# Patient Record
Sex: Female | Born: 1995 | Race: Black or African American | Hispanic: No | Marital: Single | Smoking: Never smoker
Health system: Southern US, Community
[De-identification: ages and names within clinical notes are randomized; demographics above are authoritative.]

## PROBLEM LIST (undated history)

## (undated) DIAGNOSIS — L732 Hidradenitis suppurativa: Secondary | ICD-10-CM

---
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
## 2013-11-24 ENCOUNTER — Ambulatory Visit: Payer: Self-pay | Admitting: Family Medicine

## 2014-12-14 ENCOUNTER — Ambulatory Visit
Admission: EM | Admit: 2014-12-14 | Discharge: 2014-12-14 | Payer: No Typology Code available for payment source | Attending: Family Medicine | Admitting: Family Medicine

## 2014-12-14 ENCOUNTER — Encounter: Payer: Self-pay | Admitting: Gynecology

## 2014-12-14 DIAGNOSIS — L0501 Pilonidal cyst with abscess: Secondary | ICD-10-CM

## 2014-12-14 NOTE — Discharge Instructions (Signed)
Pilonidal Cyst A pilonidal cyst occurs when hairs get trapped (ingrown) beneath the skin in the crease between the buttocks over your sacrum (the bone under that crease). Pilonidal cysts are most common in young men with a lot of body hair. When the cyst is ruptured (breaks) or leaking, fluid from the cyst may cause burning and itching. If the cyst becomes infected, it causes a painful swelling filled with pus (abscess). The pus and trapped hairs need to be removed (often by lancing) so that the infection can heal. However, recurrence is common and an operation may be needed to remove the cyst. HOME CARE INSTRUCTIONS   If the cyst was NOT INFECTED:  Keep the area clean and dry. Bathe or shower daily. Wash the area well with a germ-killing soap. Warm tub baths may help prevent infection and help with drainage. Dry the area well with a towel.  Avoid tight clothing to keep area as moisture free as possible.  Keep area between buttocks as free of hair as possible. A depilatory may be used.  If the cyst WAS INFECTED and needed to be drained:  Your caregiver packed the wound with gauze to keep the wound open. This allows the wound to heal from the inside outwards and continue draining.  Return for a wound check in 1 day or as suggested.  If you take tub baths or showers, repack the wound with gauze following them. Sponge baths (at the sink) are a good alternative.  If an antibiotic was ordered to fight the infection, take as directed.  Only take over-the-counter or prescription medicines for pain, discomfort, or fever as directed by your caregiver.  After the drain is removed, use sitz baths for 20 minutes 4 times per day. Clean the wound gently with mild unscented soap, pat dry, and then apply a dry dressing. SEEK MEDICAL CARE IF:   You have increased pain, swelling, redness, drainage, or bleeding from the area.  You have a fever.  You have muscles aches, dizziness, or a general ill  feeling. Document Released: 07/15/2000 Document Revised: 10/10/2011 Document Reviewed: 09/12/2008 Exit[employer] Patient Information 2015 [employer]. This information is not intended to replace advice given to you by your health care provider. Make sure you discuss any questions you have with your health care provider.  

## 2014-12-14 NOTE — ED Notes (Signed)
Patient c/o boil on right buttock cheek x 4 days ago. Pt. Stated boil inflamed/ redness / swelling and painful.

## 2014-12-14 NOTE — ED Provider Notes (Signed)
CSN: [id number]     Arrival date & time 12/14/14  1123 History   First MD Initiated Contact with Patient 12/14/14 1309     Chief Complaint  Patient presents with  . Recurrent Skin Infections   (Consider location/radiation/quality/duration/timing/severity/associated sxs/prior Treatment) HPI       19 year old female presents for evaluation of a pilonidal abscess. She has had one of these in the past the drain on its own. She started having pain and swelling in the gluteal cleft 5 days ago and it has gotten progressively worse. She is also felt ill with subjective fever and chills, nausea, and loss of appetite. The pain has grown increasingly severe. She does not think it has drained at all.  History reviewed. No pertinent past medical history. History reviewed. No pertinent past surgical history. No family history on file. History  Substance Use Topics  . Smoking status: Never Smoker   . Smokeless tobacco: Not on file  . Alcohol Use: No   OB History    No data available     Review of Systems  Skin: Positive for wound (see history of present illness).  All other systems reviewed and are negative.   Allergies  Review of patient's allergies indicates no known allergies.  Home Medications   Prior to Admission medications   Not on File   BP 109/72 mmHg  Pulse 106  Temp(Src) 98.2 F (36.8 C) (Tympanic)  Ht 5\' 1"  (1.549 m)  Wt 211 lb (95.709 kg)  BMI 39.89 kg/m2  SpO2 98%  LMP 11/24/2014 Physical Exam  Constitutional: She is oriented to person, place, and time. Vital signs are normal. She appears well-developed and well-nourished. No distress.  HENT:  Head: Normocephalic and atraumatic.  Pulmonary/Chest: Effort normal. No respiratory distress.  Musculoskeletal:       Back:  Neurological: She is alert and oriented to person, place, and time. She has normal strength. Coordination normal.  Skin: Skin is warm and dry. No rash noted. She is not diaphoretic.  Psychiatric: She  has a normal mood and affect. Judgment normal.  Nursing note and vitals reviewed.   ED Course  Procedures (including critical care time) Labs Review Labs Reviewed - No data to display  Imaging Review No results found.   MDM   1. Pilonidal abscess    Patient was seen with attending. We feel that with this large of an abscess she may require surgical debridement today. She also probably needs a CT scan to quantify the size of the abscess. For that reason she was discharged and her mother will drive her to the emergency department.     Graylon GoodZachary H Willis Kuipers, PA-C 12/14/14 1322

## 2017-09-10 ENCOUNTER — Ambulatory Visit
Admission: EM | Admit: 2017-09-10 | Discharge: 2017-09-10 | Disposition: A | Discharge status: Home / Self Care | Payer: Medicaid Other | Attending: Family Medicine | Admitting: Family Medicine

## 2017-09-10 ENCOUNTER — Other Ambulatory Visit: Payer: Self-pay

## 2017-09-10 DIAGNOSIS — R05 Cough: Secondary | ICD-10-CM | POA: Diagnosis not present

## 2017-09-10 DIAGNOSIS — J069 Acute upper respiratory infection, unspecified: Secondary | ICD-10-CM | POA: Diagnosis not present

## 2017-09-10 DIAGNOSIS — B9789 Other viral agents as the cause of diseases classified elsewhere: Secondary | ICD-10-CM | POA: Diagnosis not present

## 2017-09-10 MED ORDER — HYDROCOD POLST-CPM POLST ER 10-8 MG/5ML PO SUER: 5.0000 mL | Freq: Every evening | ORAL | 0 refills | Status: AC | PRN

## 2017-09-10 MED ORDER — BENZONATATE 200 MG PO CAPS: 200.0000 mg | ORAL_CAPSULE | Freq: Three times a day (TID) | ORAL | 0 refills | Status: AC | PRN

## 2017-09-10 NOTE — ED Provider Notes (Signed)
[facility]    CSN: [id number] Arrival date & time: 09/10/17  1048     History   Chief Complaint Chief Complaint  Patient presents with  . Cough    HPI Lynn Rodgers is a 22 y.o. female.   The history is provided by the patient.  Cough  Associated symptoms: rhinorrhea   Associated symptoms: no headaches, no myalgias and no wheezing   URI  Presenting symptoms: congestion, cough and rhinorrhea   Severity:  Moderate Onset quality:  Sudden Duration:  3 days Timing:  Constant Progression:  Unchanged Chronicity:  New Relieved by:  None tried Ineffective treatments:  None tried Associated symptoms: no headaches, no myalgias, no sinus pain and no wheezing   Risk factors: sick contacts   Risk factors: not elderly, no chronic cardiac disease, no chronic kidney disease, no chronic respiratory disease, no diabetes mellitus, no immunosuppression, no recent illness and no recent travel     History reviewed. No pertinent past medical history.  There are no active problems to display for this patient.   History reviewed. No pertinent surgical history.  OB History    No data available       Home Medications    Prior to Admission medications   Medication Sig Start Date End Date Taking? Authorizing Provider  benzonatate (TESSALON) 200 MG capsule Take 1 capsule (200 mg total) by mouth 3 (three) times daily as needed. 09/10/17   Payton Mccallum, MD  chlorpheniramine-HYDROcodone (TUSSIONEX PENNKINETIC ER) 10-8 MG/5ML SUER Take 5 mLs by mouth at bedtime as needed. 09/10/17   Payton Mccallum, MD    Family History Family History  Problem Relation Age of Onset  . Arthritis Mother   . Hypertension Mother     Social History Social History   Tobacco Use  . Smoking status: Never Smoker  . Smokeless tobacco: Never Used  Substance Use Topics  . Alcohol use: No  . Drug use: No     Allergies   Patient has no known allergies.   Review of Systems Review of  Systems  HENT: Positive for congestion and rhinorrhea. Negative for sinus pain.   Respiratory: Positive for cough. Negative for wheezing.   Musculoskeletal: Negative for myalgias.  Neurological: Negative for headaches.     Physical Exam Triage Vital Signs ED Triage Vitals  Enc Vitals Group     BP 09/10/17 1130 126/74     Pulse Rate 09/10/17 1130 85     Resp 09/10/17 1130 18     Temp 09/10/17 1130 98.6 F (37 C)     Temp Source 09/10/17 1130 Oral     SpO2 09/10/17 1130 99 %     Weight 09/10/17 1131 242 lb (109.8 kg)     Height 09/10/17 1129 4\' 11"  (1.499 m)     Head Circumference --      Peak Flow --      Pain Score 09/10/17 1131 5     Pain Loc --      Pain Edu? --      Excl. in [place]? --    No data found.  Updated Vital Signs BP 126/74 (BP Location: Left Arm)   Pulse 85   Temp 98.6 F (37 C) (Oral)   Resp 18   Ht 4\' 11"  (1.499 m)   Wt 242 lb (109.8 kg)   SpO2 99%   BMI 48.88 kg/m   Visual Acuity Right Eye Distance:   Left Eye Distance:   Bilateral Distance:  Right Eye Near:   Left Eye Near:    Bilateral Near:     Physical Exam  Constitutional: She appears well-developed and well-nourished. No distress.  HENT:  Head: Normocephalic and atraumatic.  Right Ear: Tympanic membrane, external ear and ear canal normal.  Left Ear: Tympanic membrane, external ear and ear canal normal.  Nose: No mucosal edema, rhinorrhea, nose lacerations, sinus tenderness, nasal deformity, septal deviation or nasal septal hematoma. No epistaxis.  No foreign bodies. Right sinus exhibits no maxillary sinus tenderness and no frontal sinus tenderness. Left sinus exhibits no maxillary sinus tenderness and no frontal sinus tenderness.  Mouth/Throat: Uvula is midline, oropharynx is clear and moist and mucous membranes are normal. No oropharyngeal exudate.  Neck: Normal range of motion. Neck supple. No thyromegaly present.  Cardiovascular: Normal rate, regular rhythm and normal heart sounds.    Pulmonary/Chest: Effort normal and breath sounds normal. No stridor. No respiratory distress. She has no wheezes. She has no rales.  Lymphadenopathy:    She has no cervical adenopathy.  Skin: She is not diaphoretic.  Nursing note and vitals reviewed.    UC Treatments / Results  Labs (all labs ordered are listed, but only abnormal results are displayed) Labs Reviewed - No data to display  EKG  EKG Interpretation None       Radiology No results found.  Procedures Procedures (including critical care time)  Medications Ordered in UC Medications - No data to display   Initial Impression / Assessment and Plan / UC Course  I have reviewed the triage vital signs and the nursing notes.  Pertinent labs & imaging results that were available during my care of the patient were reviewed by me and considered in my medical decision making (see chart for details).       Final Clinical Impressions(s) / UC Diagnoses   Final diagnoses:  Viral URI with cough    ED Discharge Orders        Ordered    benzonatate (TESSALON) 200 MG capsule  3 times daily PRN     09/10/17 1207    chlorpheniramine-HYDROcodone (TUSSIONEX PENNKINETIC ER) 10-8 MG/5ML SUER  At bedtime PRN     09/10/17 1208     1. diagnosis reviewed with patient 2. rx as per orders above; reviewed possible side effects, interactions, risks and benefits  3. Recommend supportive treatment with rest, fluids  4. Follow-up prn if symptoms worsen or don't improve  Controlled Substance Prescriptions  Controlled Substance Registry consulted? Not Applicable   Payton Mccallumonty, Samanvitha [place], MD 09/10/17 [phone]

## 2017-09-10 NOTE — ED Triage Notes (Signed)
Pt with cough x 3 days. Much worse last night and this a.m. She coughed so hard her chest and ribs hurt and she vomited.

## 2019-06-14 ENCOUNTER — Encounter: Payer: Self-pay | Admitting: Emergency Medicine

## 2019-06-14 ENCOUNTER — Other Ambulatory Visit: Payer: Self-pay

## 2019-06-14 ENCOUNTER — Ambulatory Visit
Admission: EM | Admit: 2019-06-14 | Discharge: 2019-06-14 | Disposition: A | Discharge status: Home / Self Care | Payer: BC Managed Care – PPO | Attending: Family Medicine | Admitting: Family Medicine

## 2019-06-14 DIAGNOSIS — Z20828 Contact with and (suspected) exposure to other viral communicable diseases: Secondary | ICD-10-CM

## 2019-06-14 DIAGNOSIS — J069 Acute upper respiratory infection, unspecified: Secondary | ICD-10-CM

## 2019-06-14 NOTE — ED Provider Notes (Signed)
[facility]    CSN: [id number] Arrival date & time: 06/14/19  1920      History   Chief Complaint Chief Complaint  Patient presents with  . covid testing    HPI Lynn Rodgers is a 23 y.o. female.   23 yo female with a c/o nasal congestion and cough for the past 3 days. 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 Patient denies any fevers, chills, shortness of breath.      History reviewed. No pertinent past medical history.  There are no active problems to display for this patient.   History reviewed. No pertinent surgical history.  OB History   No obstetric history on file.      Home Medications    Prior to Admission medications   Medication Sig Start Date End Date Taking? Authorizing Provider  benzonatate (TESSALON) 200 MG capsule Take 1 capsule (200 mg total) by mouth 3 (three) times daily as needed. 09/10/17   Payton Mccallum, MD  chlorpheniramine-HYDROcodone (TUSSIONEX PENNKINETIC ER) 10-8 MG/5ML SUER Take 5 mLs by mouth at bedtime as needed. 09/10/17   Payton Mccallum, MD    Family History Family History  Problem Relation Age of Onset  . Arthritis Mother   . Hypertension Mother     Social History Social History   Tobacco Use  . Smoking status: Never Smoker  . Smokeless tobacco: Never Used  Substance Use Topics  . Alcohol use: No  . Drug use: No     Allergies   Patient has no known allergies.   Review of Systems Review of Systems   Physical Exam Triage Vital Signs ED Triage Vitals  Enc Vitals Group     BP 06/14/19 1936 (!) 134/91     Pulse Rate 06/14/19 1936 74     Resp 06/14/19 1936 18     Temp 06/14/19 1936 98.3 F (36.8 C)     Temp Source 06/14/19 1936 Oral     SpO2 06/14/19 1936 98 %     Weight 06/14/19 1933 260 lb (117.9 kg)     Height 06/14/19 1933 4\' 11"  (1.499 m)     Head Circumference --      Peak Flow --      Pain Score 06/14/19 1933 0     Pain Loc --      Pain Edu? --      Excl. in GC? --    No  data found.  Updated Vital Signs BP (!) 134/91 (BP Location: Left Arm)   Pulse 74   Temp 98.3 F (36.8 C) (Oral)   Resp 18   Ht 4\' 11"  (1.499 m)   Wt 117.9 kg   SpO2 98%   BMI 52.51 kg/m   Visual Acuity Right Eye Distance:   Left Eye Distance:   Bilateral Distance:    Right Eye Near:   Left Eye Near:    Bilateral Near:     Physical Exam Vitals signs and nursing note reviewed.  Constitutional:      General: She is not in acute distress.    Appearance: She is not toxic-appearing or diaphoretic.  Cardiovascular:     Rate and Rhythm: Normal rate and regular rhythm.     Heart sounds: Normal heart sounds.  Pulmonary:     Effort: Pulmonary effort is normal. No respiratory distress.     Breath sounds: Normal breath sounds. No stridor. No wheezing, rhonchi or rales.  Neurological:     Mental Status: She is  alert.      UC Treatments / Results  Labs (all labs ordered are listed, but only abnormal results are displayed) Labs Reviewed  NOVEL CORONAVIRUS, NAA (HOSP ORDER, SEND-OUT TO REF LAB; TAT 18-24 HRS)    EKG   Radiology No results found.  Procedures Procedures (including critical care time)  Medications Ordered in UC Medications - No data to display  Initial Impression / Assessment and Plan / UC Course  I have reviewed the triage vital signs and the nursing notes.  Pertinent labs & imaging results that were available during my care of the patient were reviewed by me and considered in my medical decision making (see chart for details).     Final Clinical Impressions(s) / UC Diagnoses   Final diagnoses:  Viral URI with cough     Discharge Instructions     Rest, fluids, over the counter medication as needed Self isolate while awaiting test result    ED Prescriptions    None      1. diagnosis reviewed with patient 2. Recommend supportive treatment as above 3. Follow-up prn if symptoms worsen or don't improve  PDMP not reviewed this  encounter.   Norval Gable, MD 06/14/19 2007

## 2019-06-14 NOTE — ED Triage Notes (Signed)
Pt present to [place] for a covid test. She had a co worker that tested positive and she can not go back to work until she has been tested. Pt is having nasal congestion, cough. Started about a week ago.

## 2019-06-14 NOTE — Discharge Instructions (Addendum)
Rest, fluids, over the counter medication as needed Self isolate while awaiting test result

## 2019-06-18 ENCOUNTER — Telehealth: Payer: Self-pay | Admitting: Emergency Medicine

## 2019-06-18 LAB — NOVEL CORONAVIRUS, NAA (HOSP ORDER, SEND-OUT TO REF LAB; TAT 18-24 HRS): SARS-CoV-2, NAA: DETECTED — AB

## 2019-06-18 NOTE — Telephone Encounter (Signed)
Your test for COVID-19 was positive, meaning that you were infected with the novel coronavirus and could give the germ to others.  Please continue isolation at home, for at least 10 days since the start of your fever/cough/breathlessness and until you have had 3 consecutive days without fever (without taking a fever reducer) and with cough/breathlessness improving. Please continue good preventive care measures, including:  frequent hand-washing, avoid touching your face, cover coughs/sneezes, stay out of crowds and keep a 6 foot distance from others.  Recheck or go to the nearest hospital ED tent for re-assessment if fever/cough/breathlessness return.  Patient contacted and made aware of all results, all questions answered.   

## 2019-07-30 ENCOUNTER — Ambulatory Visit
Admission: EM | Admit: 2019-07-30 | Discharge: 2019-07-30 | Disposition: A | Discharge status: Home / Self Care | Payer: BC Managed Care – PPO | Attending: Family Medicine | Admitting: Family Medicine

## 2019-07-30 ENCOUNTER — Other Ambulatory Visit: Payer: Self-pay

## 2019-07-30 DIAGNOSIS — Z20828 Contact with and (suspected) exposure to other viral communicable diseases: Secondary | ICD-10-CM | POA: Diagnosis not present

## 2019-07-30 DIAGNOSIS — Z20822 Contact with and (suspected) exposure to covid-19: Secondary | ICD-10-CM

## 2019-07-30 NOTE — Discharge Instructions (Signed)
Results available in 24 to 48 hours.  Take care  Dr. Keyasha Miah   

## 2019-07-30 NOTE — ED Triage Notes (Signed)
Patient states that she is here for Covid testing. Reports that she is flying to [facility] on Friday and has to have covid testing 72 hours out.

## 2019-07-30 NOTE — ED Provider Notes (Signed)
[facility]    CSN: [id number] Arrival date & time: 07/30/19  1244    History   Chief Complaint Chief Complaint  Patient presents with  . covid testing   HPI  23 year old female presents for Covid testing.  Patient, her spouse, and her child are traveling out of the country to [place].  They are traveling on Friday.  They are required to have Covid testing.  She is currently feeling well.  She has had COVID-19 recently.  She desires testing today.  No other complaints or concerns at this time.  PMH, Surgical Hx, Family Hx, Social History reviewed and updated as below.  PMH: Hydradenitis  Surgical Hx: REMOVAL FOREIGN BODY BACK 12/31/2014 Back/N/A Procedure: REMOVAL FOREIGN BODY BACK; Surgeon: Rushie Nyhan, MD; Location: [facility]; Service: General Surgery; Laterality: N/A;   CESAREAN DELIVERY 01/07/2016 N/A Procedure: Primary low transverse cesarean section; Surgeon: Lorene Dy, MD; Location: [facility]; Service: Obstetrics; Laterality: N/A;      Home Medications    Prior to Admission medications   Medication Sig Start Date End Date Taking? Authorizing Provider  benzonatate (TESSALON) 200 MG capsule Take 1 capsule (200 mg total) by mouth 3 (three) times daily as needed. 09/10/17   Norval Gable, MD  chlorpheniramine-HYDROcodone (TUSSIONEX PENNKINETIC ER) 10-8 MG/5ML SUER Take 5 mLs by mouth at bedtime as needed. 09/10/17   Norval Gable, MD    Family History Family History  Problem Relation Age of Onset  . Arthritis Mother   . Hypertension Mother     Social History Social History   Tobacco Use  . Smoking status: Never Smoker  . Smokeless tobacco: Never Used  Substance Use Topics  . Alcohol use: No  . Drug use: No     Allergies   Patient has no known allergies.   Review of Systems Review of Systems  Constitutional: Negative.   HENT: Negative.   Respiratory: Negative.      Physical Exam Triage  Vital Signs ED Triage Vitals  Enc Vitals Group     BP 07/30/19 1312 (!) 122/103     Pulse Rate 07/30/19 1312 69     Resp 07/30/19 1312 18     Temp 07/30/19 1312 98.3 F (36.8 C)     Temp Source 07/30/19 1312 Oral     SpO2 07/30/19 1312 98 %     Weight 07/30/19 1309 257 lb 15 oz (117 kg)     Height --      Head Circumference --      Peak Flow --      Pain Score 07/30/19 1309 0     Pain Loc --      Pain Edu? --      Excl. in [place]? --    No data found.  Updated Vital Signs BP (!) 122/103 (BP Location: Left Arm)   Pulse 69   Temp 98.3 F (36.8 C) (Oral)   Resp 18   Wt 117 kg   SpO2 98%   BMI 52.10 kg/m   Visual Acuity Right Eye Distance:   Left Eye Distance:   Bilateral Distance:    Right Eye Near:   Left Eye Near:    Bilateral Near:     Physical Exam Vitals and nursing note reviewed.  Constitutional:      General: She is not in acute distress.    Appearance: Normal appearance. She is obese. She is not ill-appearing.  HENT:  Head: Normocephalic and atraumatic.  Eyes:     General:        Right eye: No discharge.        Left eye: No discharge.     Conjunctiva/sclera: Conjunctivae normal.  Cardiovascular:     Rate and Rhythm: Normal rate and regular rhythm.     Heart sounds: No murmur.  Pulmonary:     Effort: Pulmonary effort is normal.     Breath sounds: Normal breath sounds. No wheezing, rhonchi or rales.  Neurological:     Mental Status: She is alert.  Psychiatric:        Mood and Affect: Mood normal.        Behavior: Behavior normal.    UC Treatments / Results  Labs (all labs ordered are listed, but only abnormal results are displayed) Labs Reviewed  NOVEL CORONAVIRUS, NAA (HOSP ORDER, SEND-OUT TO REF LAB; TAT 18-24 HRS)    EKG   Radiology No results found.  Procedures Procedures (including critical care time)  Medications Ordered in UC Medications - No data to display  Initial Impression / Assessment and Plan / UC Course  I have  reviewed the triage vital signs and the nursing notes.  Pertinent labs & imaging results that were available during my care of the patient were reviewed by me and considered in my medical decision making (see chart for details).    23 year old female presents for Covid testing prior to travel.  Asymptomatic.  Awaiting test results.  Final Clinical Impressions(s) / UC Diagnoses   Final diagnoses:  Encounter for screening laboratory testing for COVID-19 virus in asymptomatic patient     Discharge Instructions     Results available in 24 to 48 hours.  Take care  Dr. Adriana Simas     ED Prescriptions    None     PDMP not reviewed this encounter.   Tommie Sams, [place] 07/30/19 1358

## 2019-07-31 LAB — NOVEL CORONAVIRUS, NAA (HOSP ORDER, SEND-OUT TO REF LAB; TAT 18-24 HRS): SARS-CoV-2, NAA: NOT DETECTED

## 2020-06-27 ENCOUNTER — Ambulatory Visit
Admission: EM | Admit: 2020-06-27 | Discharge: 2020-06-27 | Disposition: A | Discharge status: Home / Self Care | Payer: BC Managed Care – PPO

## 2020-06-27 ENCOUNTER — Encounter: Payer: Self-pay | Admitting: Emergency Medicine

## 2020-06-27 ENCOUNTER — Other Ambulatory Visit: Payer: Self-pay

## 2020-06-27 DIAGNOSIS — L219 Seborrheic dermatitis, unspecified: Secondary | ICD-10-CM | POA: Diagnosis not present

## 2020-06-27 HISTORY — DX: Hidradenitis suppurativa: L73.2

## 2020-06-27 NOTE — ED Triage Notes (Signed)
Pt c/o rash in scalp x 1.5 months. Pt states initially she had braid with extensions in, once she took out the extensions she noticed some bumps along the back of her hairline. 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 She has been doing hair treatments in the salon and her scalp continues to itch. She states she has scabs in her scalp that are very itchy and she has noticed some oozing from scalp.

## 2020-06-27 NOTE — ED Provider Notes (Signed)
[facility] - [facility] - [place], [place]   Name: Lynn Rodgers DOB: 1996-01-21 MRN: [id number] CSN: [id number] PCP: Rayetta Humphrey, MD  Arrival date and time:  06/27/20 1430  Chief Complaint:  Allergic Reaction   NOTE: Prior to seeing the patient today, I have reviewed the triage nursing documentation and vital signs. Clinical staff has updated patient's PMH/PSHx, current medication list, and drug allergies/intolerances to ensure comprehensive history available to assist in medical decision making.   History:   HPI: Lynn Rodgers is a 24 y.o. female who presents today with complaints of scalp irritation. Patient says she initially felt this irritation approximately 2 months ago. At that time, she had braids with extensions. She has noticed that her braids were itchier than usual. Upon further questioning the hairstylist, she was told that the hairstylist use a new type of hair to complete her braids. Upon removing her braid extensions, she noticed a flaky rash to her hairline. She was told by multiple hairstylist that she needed to let her "scalp heal", so she did not apply any further treatments or products to her hair. The rash continued to progress to the rest of her scalp and behind her ears. The severity of the rash continued to increase in the center of the scalp to the point of oozing.   She states she has never had a condition like this before. She denies any sensitivity to hair products or hair extensions.  She sees a dermatologist every 2 weeks for her hidradenitis suppurativa treatments.   Past Medical History:  Diagnosis Date  . Hidradenitis suppurativa     History reviewed. No pertinent surgical history.  Family History  Problem Relation Age of Onset  . Arthritis Mother   . Hypertension Mother     Social History   Tobacco Use  . Smoking status: Never Smoker  . Smokeless tobacco: Never Used  Vaping Use  . Vaping Use: Never used  Substance Use Topics  .  Alcohol use: No  . Drug use: No    There are no problems to display for this patient.   Home Medications:    No outpatient medications have been marked as taking for the 06/27/20 encounter [facility] Encounter).    Allergies:   Patient has no known allergies.  Review of Systems (ROS): Review of Systems  Skin: Positive for rash.       To scalp and behind ears  All other systems reviewed and are negative.    Vital Signs: Today's Vitals   06/27/20 1447 06/27/20 1449 06/27/20 1519  BP: (!) 143/105    Pulse: 85    Temp: 98.5 F (36.9 C)    TempSrc: Oral    SpO2: 100%    PainSc:  7  7     Physical Exam: Physical Exam Vitals and nursing note reviewed.  Constitutional:      Appearance: Normal appearance.  Cardiovascular:     Rate and Rhythm: Normal rate and regular rhythm.     Pulses: Normal pulses.     Heart sounds: Normal heart sounds.  Pulmonary:     Breath sounds: Normal breath sounds.  Skin:    General: Skin is warm and dry.     Findings: Rash present. Rash is crusting and scaling.     Comments: Drainage to center of scalp  Neurological:     Mental Status: She is alert.  Psychiatric:        Mood and Affect: Mood normal.  Behavior: Behavior normal.        Thought Content: Thought content normal.      Urgent Care Treatments / Results:   LABS: PLEASE NOTE: all labs that were ordered this encounter are listed, however only abnormal results are displayed. Labs Reviewed - No data to display  EKG: -None  RADIOLOGY: No results found.  PROCEDURES: Procedures  MEDICATIONS RECEIVED THIS VISIT: Medications - No data to display  PERTINENT CLINICAL COURSE NOTES/UPDATES:   Initial Impression / Assessment and Plan / Urgent Care Course:  Pertinent labs & imaging results that were available during my care of the patient were personally reviewed by me and considered in my medical decision making (see lab/imaging section of note for values and  interpretations).  Lynn Rodgers is a 24 y.o. female who presents to [facility] today with complaints of scalp irritation, diagnosed with seborrheic dermatitis, and treated as such with the directions below. NP and patient reviewed discharge instructions below during visit.   Patient is well appearing overall in clinic today. She does not appear to be in any acute distress. Presenting symptoms (see HPI) and exam as documented above.   I have reviewed the follow up and strict return precautions for any new or worsening symptoms. Patient is aware of symptoms that would be deemed urgent/emergent, and would thus require further evaluation either here or in the emergency department. At the time of discharge, she verbalized understanding and consent with the discharge plan as it was reviewed with her. All questions were fielded by provider and/or clinic staff prior to patient discharge.    Final Clinical Impressions / Urgent Care Diagnoses:   Final diagnoses:  Seborrheic dermatitis of scalp    New Prescriptions:  [place] Controlled Sub[place] Registry consulted? Not Applicable  No orders of the defined types were placed in this encounter.     Discharge Instructions     You were seen for scalp drainage and are being treated for severe seborrheic dermatitis (dandruff).   Make sure you follow-up with your dermatologist as scheduled.  Until then, use a gentle, calming oil treatment (jojoba, castor, etc.) prior to shampooing your scalp with head and shoulders.  Follow with the head and shoulders conditioner.  Moisturize your scalp with the same gentle oil and place hair in a gentle style until your dermatologist appointment.  Take care, Dr. Sharlet Salina, NP-c     Recommended Follow up Care:  Patient encouraged to follow up with the following provider within the specified time frame, or sooner as dictated by the severity of her symptoms. As always, she was instructed that for any  urgent/emergent care needs, she should seek care either here or in the emergency department for more immediate evaluation.   Bailey Mech, DNP, NP-c    Bailey Mech, NP 06/27/20 9417

## 2020-06-27 NOTE — Discharge Instructions (Signed)
You were seen for scalp drainage and are being treated for severe seborrheic dermatitis (dandruff).   Make sure you follow-up with your dermatologist as scheduled.  Until then, use a gentle, calming oil treatment (jojoba, castor, etc.) prior to shampooing your scalp with head and shoulders.  Follow with the head and shoulders conditioner.  Moisturize your scalp with the same gentle oil and place hair in a gentle style until your dermatologist appointment.  Take care, Dr. Sharlet Salina, NP-c

## 2021-06-21 ENCOUNTER — Emergency Department: Payer: No Typology Code available for payment source

## 2021-06-21 ENCOUNTER — Emergency Department
Admission: EM | Admit: 2021-06-21 | Discharge: 2021-06-21 | Disposition: A | Discharge status: Home / Self Care | Payer: No Typology Code available for payment source | Attending: Emergency Medicine | Admitting: Emergency Medicine

## 2021-06-21 ENCOUNTER — Other Ambulatory Visit: Payer: Self-pay

## 2021-06-21 ENCOUNTER — Encounter: Payer: Self-pay | Admitting: Emergency Medicine

## 2021-06-21 DIAGNOSIS — S161XXA Strain of muscle, fascia and tendon at neck level, initial encounter: Secondary | ICD-10-CM | POA: Insufficient documentation

## 2021-06-21 DIAGNOSIS — R0789 Other chest pain: Secondary | ICD-10-CM | POA: Diagnosis not present

## 2021-06-21 DIAGNOSIS — Y92410 Unspecified street and highway as the place of occurrence of the external cause: Secondary | ICD-10-CM | POA: Diagnosis not present

## 2021-06-21 DIAGNOSIS — S0990XA Unspecified injury of head, initial encounter: Secondary | ICD-10-CM | POA: Diagnosis not present

## 2021-06-21 DIAGNOSIS — S39012A Strain of muscle, fascia and tendon of lower back, initial encounter: Secondary | ICD-10-CM | POA: Insufficient documentation

## 2021-06-21 DIAGNOSIS — S169XXA Unspecified injury of muscle, fascia and tendon at neck level, initial encounter: Secondary | ICD-10-CM | POA: Diagnosis present

## 2021-06-21 MED ORDER — OXYCODONE-ACETAMINOPHEN 5-325 MG PO TABS
1.0000 | ORAL_TABLET | Freq: Once | ORAL | Status: AC
  Administered 2021-06-21: 1 via ORAL
  Filled 2021-06-21: qty 1

## 2021-06-21 MED ORDER — TRAMADOL HCL 50 MG PO TABS: 50.0000 mg | ORAL_TABLET | Freq: Four times a day (QID) | ORAL | 0 refills | Status: AC | PRN

## 2021-06-21 MED ORDER — IBUPROFEN 600 MG PO TABS: 600.0000 mg | ORAL_TABLET | Freq: Four times a day (QID) | ORAL | 0 refills | Status: AC | PRN
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
## 2021-06-21 NOTE — Discharge Instructions (Signed)
Take the ibuprofen as the primary pain medication, and the tramadol only if needed for pain not relieved by the ibuprofen.  Return to the ER for new, worsening, or persistent severe pain, severe headache, vomiting, vision changes, weakness or numbness, or any other new or worsening symptoms that concern you.
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
## 2021-12-31 ENCOUNTER — Emergency Department
Admission: EM | Admit: 2021-12-31 | Discharge: 2021-12-31 | Disposition: A | Discharge status: Home / Self Care | Payer: Commercial Managed Care - PPO | Attending: Emergency Medicine | Admitting: Emergency Medicine

## 2021-12-31 ENCOUNTER — Encounter: Payer: Self-pay | Admitting: Medical Oncology

## 2021-12-31 DIAGNOSIS — M546 Pain in thoracic spine: Secondary | ICD-10-CM | POA: Insufficient documentation

## 2021-12-31 DIAGNOSIS — M545 Low back pain, unspecified: Secondary | ICD-10-CM | POA: Diagnosis present

## 2021-12-31 MED ORDER — LIDOCAINE 5 % EX PTCH: 1.0000 | MEDICATED_PATCH | Freq: Two times a day (BID) | CUTANEOUS | 0 refills | Status: AC

## 2021-12-31 MED ORDER — CYCLOBENZAPRINE HCL 10 MG PO TABS: 10.0000 mg | ORAL_TABLET | Freq: Three times a day (TID) | ORAL | 0 refills | Status: AC | PRN
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
## 2021-12-31 NOTE — Discharge Instructions (Addendum)
-  Take Tylenol and utilize lidocaine patches as needed for pain.  You may additionally utilize cyclobenzaprine, though be cautious as it may make you sleepy/drowsy during the day.  -Follow-up with your primary care provider as needed.  -Return to the emergency department anytime if you begin to experience any new or worsening symptoms.

## 2022-12-14 IMAGING — CT CT HEAD W/O CM
4 series · 16 of 47 positions shown, 18 images · non-contrast
Comparison: None.

CLINICAL DATA: Trauma.

EXAM:
CT HEAD WITHOUT CONTRAST
CT CERVICAL SPINE WITHOUT CONTRAST
TECHNIQUE: Multidetector CT imaging of the head and cervical spine was
performed following the standard protocol without intravenous
contrast. Multiplanar CT image reconstructions of the cervical spine
were also generated.

[Series 2: head wo · axial · 0.43mm/px · z∈[-27,+93]mm · 7 of 33 slices shown, 9 images]
[im 5/33  brain]
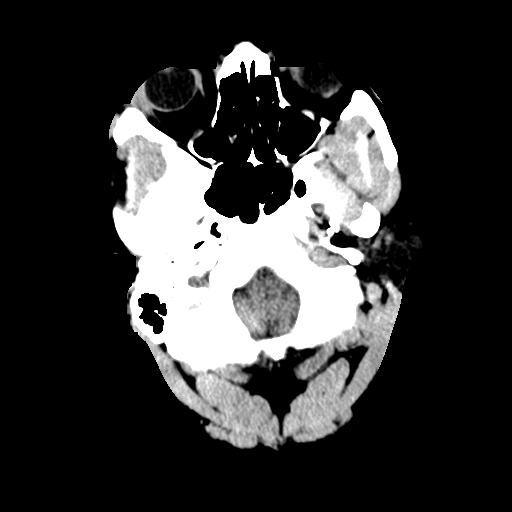
[im 5/33  bone]
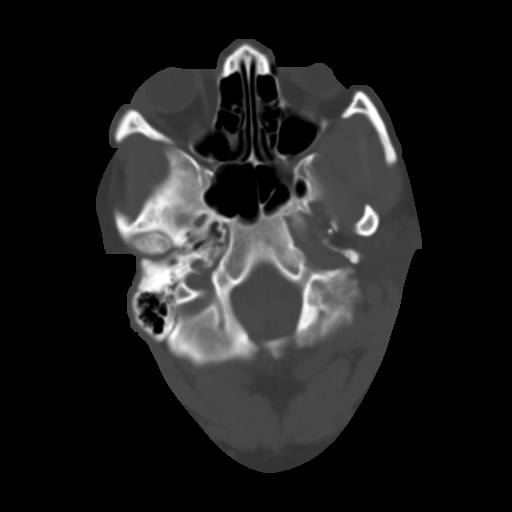
[im 9/33  brain]
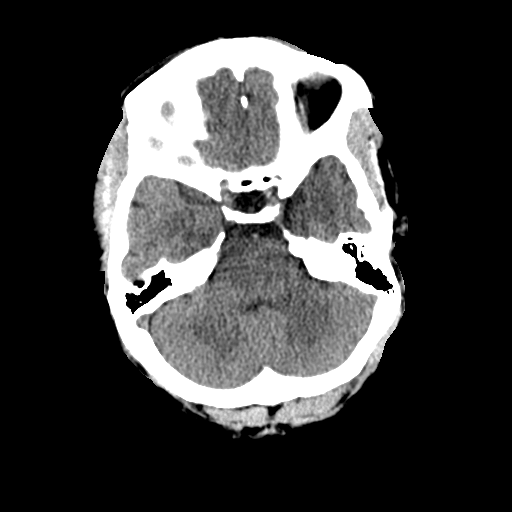
[im 13/33  brain]
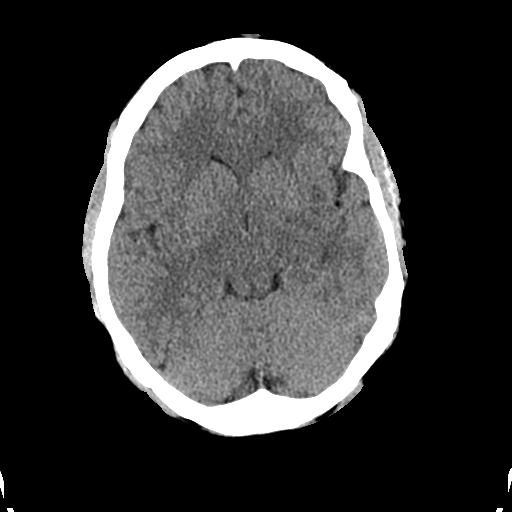
[im 17/33  brain]
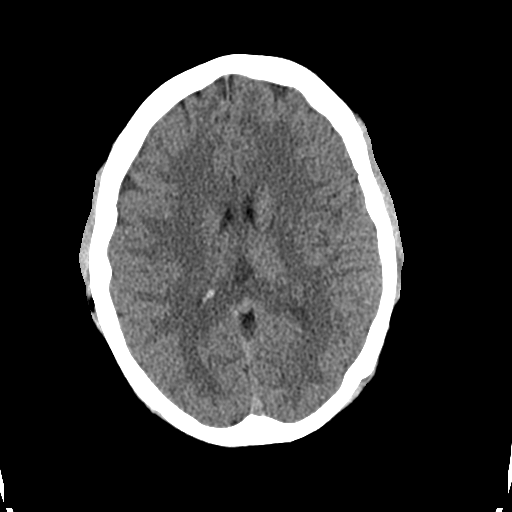
[im 21/33  brain]
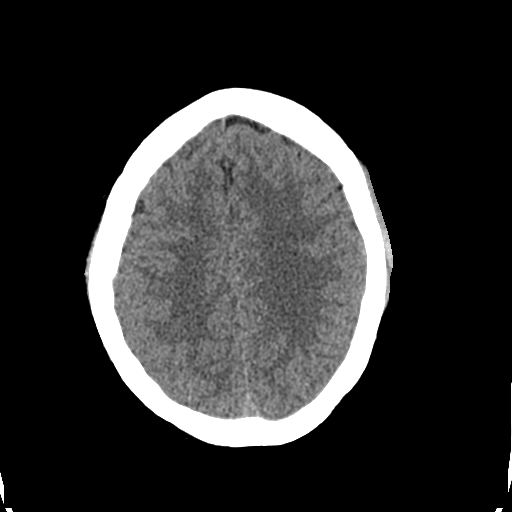
[im 21/33  bone]
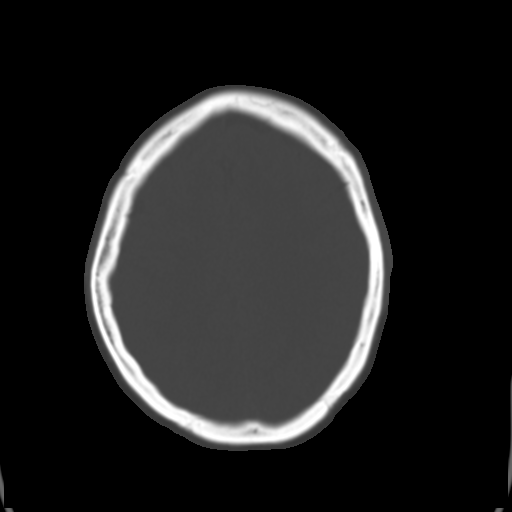
[im 25/33  brain]
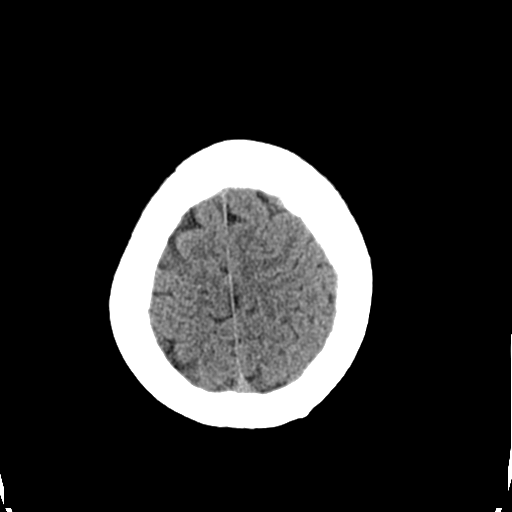
[im 29/33  brain]
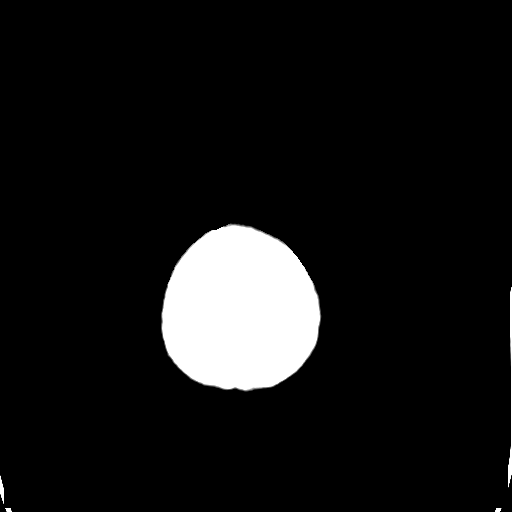

[Series 3: head bone · axial · 0.43mm/px · z∈[-31,+1]mm · 3 of 83 slices shown]
[im 9/83  bone]
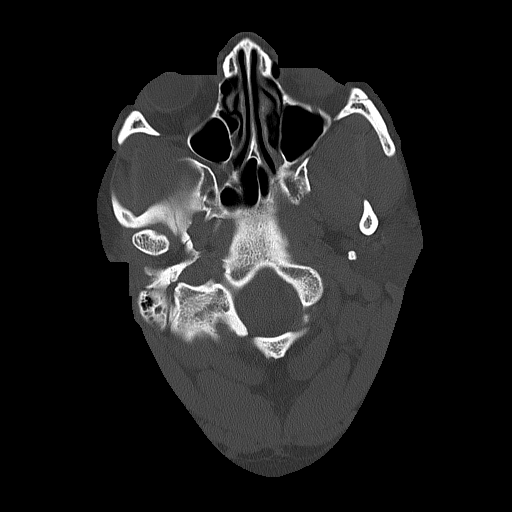
[im 17/83  bone]
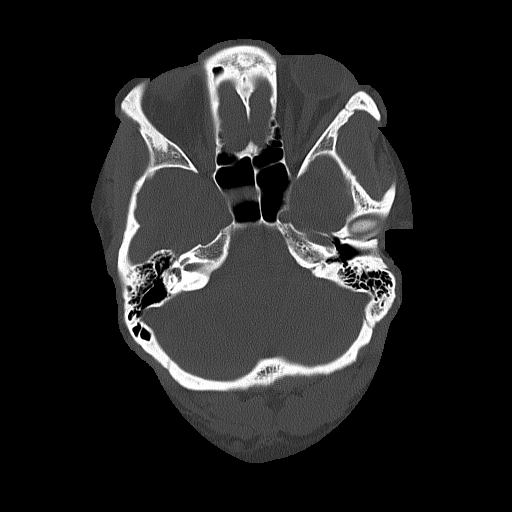
[im 25/83  bone]
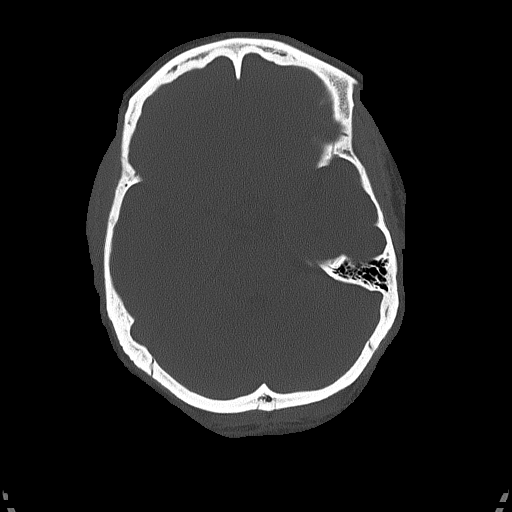

[Series 4: coronal soft tissue · coronal · 0.33mm/px · 3 of 69 slices shown]
[im 23/69  brain]
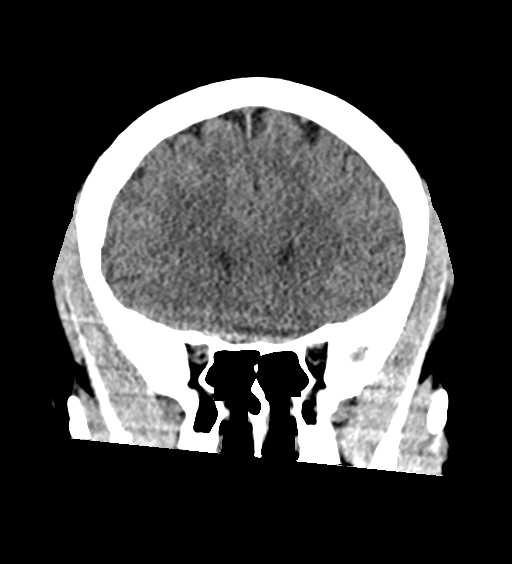
[im 31/69  brain]
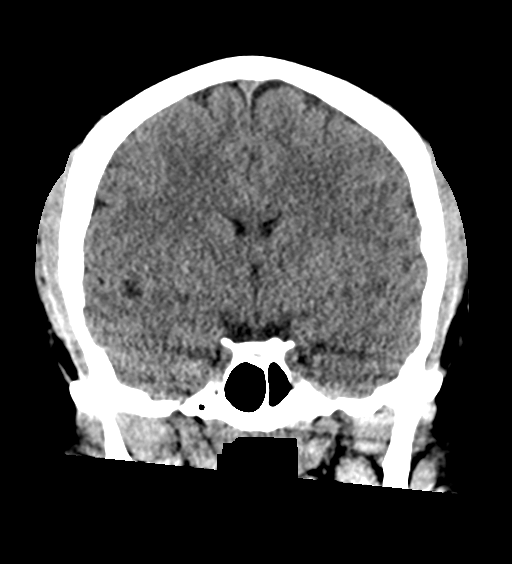
[im 38/69  brain]
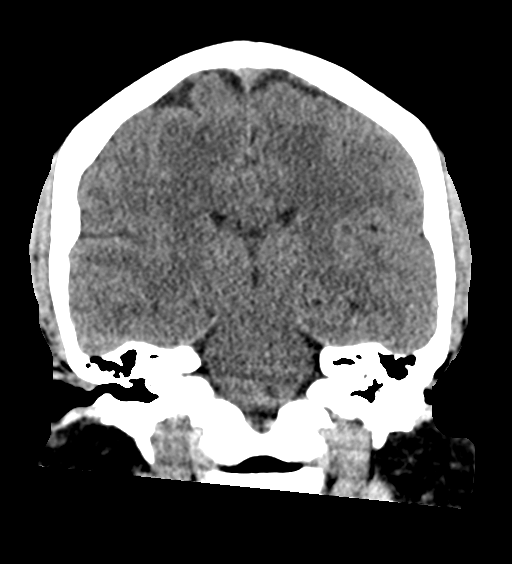

[Series 5: sagittal soft tissue · sagittal · 0.36mm/px · 3 of 56 slices shown]
[im 19/56  brain]
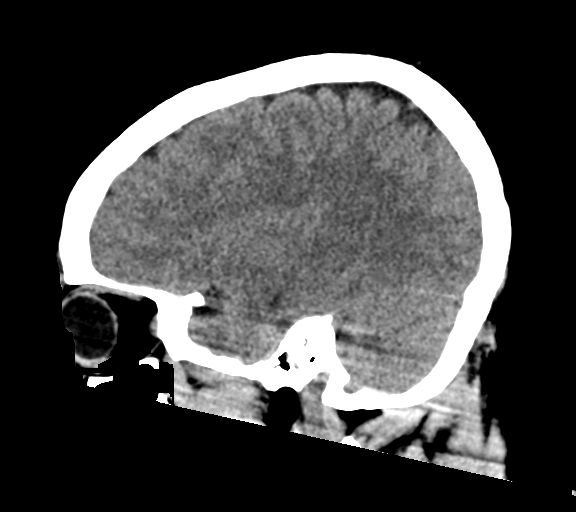
[im 28/56  brain]
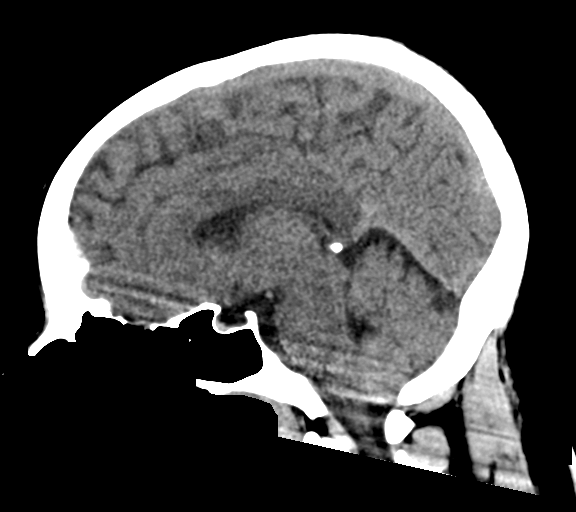
[im 37/56  brain]
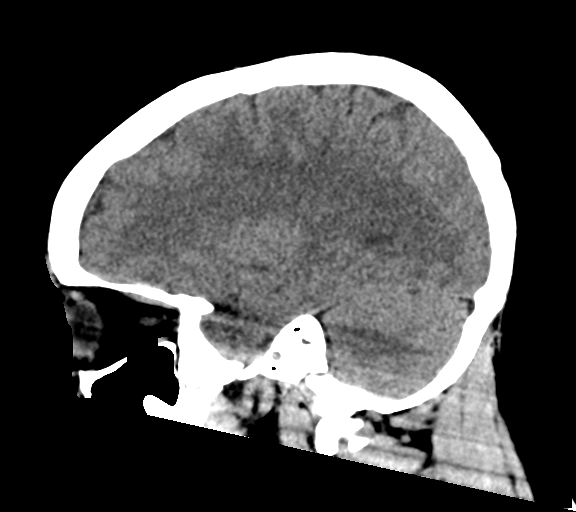

[16 of 47 positions shown; findings below may reference images not displayed]

FINDINGS: CT HEAD FINDINGS

Brain: No evidence of acute infarction, hemorrhage, hydrocephalus,
extra-axial collection or mass lesion/mass effect.

Vascular: No hyperdense vessel or unexpected calcification.

Skull: Normal. Negative for fracture or focal lesion.

Sinuses/Orbits: No acute finding.

Other: A 1 cm subcutaneous lesion over the forehead, likely a
sebaceous cyst.

CT CERVICAL SPINE FINDINGS

Alignment: No acute subluxation. There is straightening of normal
cervical lordosis which may be positional or due to muscle spasm.

Skull base and vertebrae: No acute fracture. No primary bone lesion
or focal pathologic process.

Soft tissues and spinal canal: No prevertebral fluid or swelling. No
visible canal hematoma.

Disc levels:  No acute findings.  No degenerative changes.

Upper chest: Negative.

Other: None
IMPRESSION: 1. Normal noncontrast CT of the brain.
2. No acute/traumatic cervical spine pathology.
